# Patient Record
Sex: Female | Born: 2000 | Race: White | Hispanic: No | Marital: Single | Smoking: Never smoker
Health system: Southern US, Community
[De-identification: ages and names within clinical notes are randomized; demographics above are authoritative.]

## PROBLEM LIST (undated history)

## (undated) DIAGNOSIS — N159 Renal tubulo-interstitial disease, unspecified: Secondary | ICD-10-CM

## (undated) DIAGNOSIS — J45909 Unspecified asthma, uncomplicated: Secondary | ICD-10-CM

## (undated) DIAGNOSIS — F909 Attention-deficit hyperactivity disorder, unspecified type: Secondary | ICD-10-CM

## (undated) DIAGNOSIS — T7840XA Allergy, unspecified, initial encounter: Secondary | ICD-10-CM

## (undated) DIAGNOSIS — F419 Anxiety disorder, unspecified: Secondary | ICD-10-CM

## (undated) HISTORY — DX: Allergy, unspecified, initial encounter: T78.40XA

## (undated) HISTORY — DX: Anxiety disorder, unspecified: F41.9

## (undated) HISTORY — DX: Unspecified asthma, uncomplicated: J45.909

---
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
## 2022-02-18 ENCOUNTER — Emergency Department (HOSPITAL_COMMUNITY)
Admission: EM | Admit: 2022-02-18 | Discharge: 2022-02-18 | Disposition: A | Discharge status: Home / Self Care | Payer: No Typology Code available for payment source | Attending: Emergency Medicine | Admitting: Emergency Medicine

## 2022-02-18 ENCOUNTER — Encounter (HOSPITAL_COMMUNITY): Payer: Self-pay

## 2022-02-18 ENCOUNTER — Other Ambulatory Visit: Payer: Self-pay

## 2022-02-18 DIAGNOSIS — S61052A Open bite of left thumb without damage to nail, initial encounter: Secondary | ICD-10-CM | POA: Insufficient documentation

## 2022-02-18 DIAGNOSIS — S6992XA Unspecified injury of left wrist, hand and finger(s), initial encounter: Secondary | ICD-10-CM | POA: Diagnosis present

## 2022-02-18 DIAGNOSIS — W5501XA Bitten by cat, initial encounter: Secondary | ICD-10-CM | POA: Insufficient documentation

## 2022-02-18 DIAGNOSIS — Z203 Contact with and (suspected) exposure to rabies: Secondary | ICD-10-CM | POA: Insufficient documentation

## 2022-02-18 DIAGNOSIS — Y990 Civilian activity done for income or pay: Secondary | ICD-10-CM | POA: Insufficient documentation

## 2022-02-18 DIAGNOSIS — Z2914 Encounter for prophylactic rabies immune globin: Secondary | ICD-10-CM | POA: Diagnosis not present

## 2022-02-18 DIAGNOSIS — Z23 Encounter for immunization: Secondary | ICD-10-CM | POA: Diagnosis not present

## 2022-02-18 MED ORDER — RABIES VACCINE, PCEC IM SUSR
1.0000 mL | Freq: Once | INTRAMUSCULAR | Status: AC
  Administered 2022-02-18: 1 mL via INTRAMUSCULAR
  Filled 2022-02-18: qty 1

## 2022-02-18 MED ORDER — TETANUS-DIPHTH-ACELL PERTUSSIS 5-2.5-18.5 LF-MCG/0.5 IM SUSY
0.5000 mL | PREFILLED_SYRINGE | Freq: Once | INTRAMUSCULAR | Status: AC
  Administered 2022-02-18: 0.5 mL via INTRAMUSCULAR
  Filled 2022-02-18 (×2): qty 0.5

## 2022-02-18 MED ORDER — CIPROFLOXACIN HCL 500 MG PO TABS: 500.0000 mg | ORAL_TABLET | Freq: Two times a day (BID) | ORAL | 0 refills | Status: AC

## 2022-02-18 MED ORDER — RABIES IMMUNE GLOBULIN 150 UNIT/ML IM INJ
20.0000 [IU]/kg | INJECTION | Freq: Once | INTRAMUSCULAR | Status: AC
  Administered 2022-02-18: 1200 [IU] via INTRAMUSCULAR
  Filled 2022-02-18: qty 8

## 2022-02-18 MED ORDER — CIPROFLOXACIN HCL 500 MG PO TABS
500.0000 mg | ORAL_TABLET | Freq: Once | ORAL | Status: AC
  Administered 2022-02-18: 500 mg via ORAL
  Filled 2022-02-18: qty 1

## 2022-02-18 NOTE — Discharge Instructions (Signed)
Return for any of the following signs of wound infection: worsening swelling, redness, pain, pus drainage, streaking or fever.  

## 2022-02-18 NOTE — ED Triage Notes (Signed)
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 Bite is on left thumb. It did break the skin ?

## 2022-02-18 NOTE — ED Provider Triage Note (Signed)
Emergency Medicine Provider Triage Evaluation Note ? ?Kathleen Wyatt , a 21 y.o. female  was evaluated in triage.  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  Sent to the ED for evaluation and rabies vaccination. ? ?Review of Systems  ?Positive: Animal bite to the left thumb, hemostatic no evidence of dried skin on the thumb. ?Negative: Nausea, vomiting and diarrhea ? ?Physical Exam  ?BP 124/84 (BP Location: Right Arm)   Pulse 80   Temp 98.1 ?F (36.7 ?C) (Oral)   Resp 18   Ht 5\' 6"  (1.676 m)   Wt 59 kg   SpO2 100%   BMI 20.98 kg/m?  ?Gen:   Awake, no distress   ?Resp:  Normal effort  ?MSK:   Moves extremities without difficulty  ?Other:  Cat bite to the dorsum of the left thumb, hemostatic at this time.  No surrounding erythema, induration, or bleeding at this time. ? ?Medical Decision Making  ?Medically screening exam initiated at 2:37 AM.  Appropriate orders placed.  Kathleen Wyatt was informed that the remainder of the evaluation will be completed by another provider, this initial triage assessment does not replace that evaluation, and the importance of remaining in the ED until their evaluation is complete. ? ?Patient with history of severe hives to penicillins in the past.  Will order ciprofloxacin. ? ?This chart was dictated using voice recognition software, Dragon. Despite the best efforts of this provider to proofread and correct errors, errors may still occur which can change documentation meaning. ? ?  ?August Saucer, PA-C ?02/18/22 0249 ? ?

## 2022-02-18 NOTE — ED Provider Notes (Addendum)
?[facility] EMERGENCY DEPARTMENT ?Provider Note ? ? ?CSN: [id number] ?Arrival date & time: 02/18/22  0137 ? ?  ? ?History ? ?Chief Complaint  ?Patient presents with  ? Animal Bite  ? ? ?Kathleen Wyatt is a 21 y.o. female. ? 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  She was bitten by an unvaccinated cat at work [employer].  Has an abrasion to L thumb.  The cat was euthanized, is being sent for rabies testing. Pt would like to begin rabies vaccine series.  ? ? ?  ? ?Home Medications ?Prior to Admission medications   ?Medication Sig Start Date End Date Taking? Authorizing Provider  ?ciprofloxacin (CIPRO) 500 MG tablet Take 1 tablet (500 mg total) by mouth every 12 (twelve) hours. 02/18/22  Yes Viviano Simas, NP  ?albuterol (PROAIR HFA) 108 (90 Base) MCG/ACT inhaler Inhale 2 puffs into the lungs every 4 (four) hours as needed for wheezing or shortness of breath. 06/23/18   Alfonse Spruce, MD  ?cetirizine (ZYRTEC) 10 MG chewable tablet Chew 10 mg by mouth daily.    [provider]  ?Cholecalciferol (VITAMIN D PO) Take by mouth.    [provider]  ?cyproheptadine (PERIACTIN) 4 MG tablet Take 4 mg by mouth 2 (two) times daily.  ?Patient not taking: Reported on 07/09/2020 01/28/16   [provider]  ?dexmethylphenidate (FOCALIN XR) 20 MG 24 hr capsule Take 20 mg by mouth daily. 03/23/16   [provider]  ?EPINEPHrine (EPIPEN 2-PAK) 0.3 mg/0.3 mL IJ SOAJ injection Use as directed for severe allergic reactions 06/23/18   Alfonse Spruce, MD  ?fluticasone [facility]) 50 MCG/ACT nasal spray Place 2 sprays into both nostrils daily. 02/28/16   Fletcher Anon, MD  ?ibuprofen (ADVIL,MOTRIN) 100 MG chewable tablet Chew 100 mg by mouth every 8 (eight) hours as needed.    [provider]  ?   ? ?Allergies    ?Amoxicillin, Bee venom, Fire ant, Insect extract allergy skin test, Penicillins, Almond oil, Benadryl [diphenhydramine hcl], and Other   ? ?Review of Systems    ?Review of Systems  ?Skin:  Positive for wound.  ?All other systems reviewed and are negative. ? ?Physical Exam ?Updated Vital Signs ?BP 124/84 (BP Location: Right Arm)   Pulse 80   Temp 98.1 ?F (36.7 ?C) (Oral)   Resp 18   Ht 5\' 6"  (1.676 m)   Wt 59 kg   SpO2 100%   BMI 20.98 kg/m?  ?Physical Exam ?Vitals and nursing note reviewed.  ?Constitutional:   ?   Appearance: Normal appearance.  ?HENT:  ?   Head: Normocephalic and atraumatic.  ?   Nose: Nose normal.  ?   Mouth/Throat:  ?   Mouth: Mucous membranes are moist.  ?   Pharynx: Oropharynx is clear.  ?Eyes:  ?   Extraocular Movements: Extraocular movements intact.  ?   Conjunctiva/sclera: Conjunctivae normal.  ?Cardiovascular:  ?   Rate and Rhythm: Normal rate.  ?   Pulses: Normal pulses.  ?Pulmonary:  ?   Effort: Pulmonary effort is normal.  ?Abdominal:  ?   General: There is no distension.  ?   Palpations: Abdomen is soft.  ?Musculoskeletal:     ?   General: Normal range of motion.  ?   Cervical back: Normal range of motion.  ?Skin: ?   General: Skin is warm and dry.  ?   Capillary Refill: Capillary refill takes less than 2 seconds.  ?  Findings: No rash.  ?   Comments: Abrasion to dorsal L thumb ~3-4 mm in length, linear.   ?Neurological:  ?   General: No focal deficit present.  ?   Mental Status: She is alert and oriented to person, place, and time.  ?   Coordination: Coordination normal.  ? ? ?ED Results / Procedures / Treatments   ?Labs ?(all labs ordered are listed, but only abnormal results are displayed) ?Labs Reviewed  ?PREGNANCY, URINE  ? ? ?EKG ?None ? ?Radiology ?No results found. ? ?Procedures ?Procedures  ? ? ?Medications Ordered in ED ?Medications  ?rabies immune globulin (HYPERAB/KEDRAB) injection 1,200 Units (has no administration in time range)  ?rabies vaccine (RABAVERT) injection 1 mL (1 mL Intramuscular Given 02/18/22 0424)  ?Tdap (BOOSTRIX) injection 0.5 mL (0.5 mLs Intramuscular Given 02/18/22 0428)  ?ciprofloxacin (CIPRO) tablet 500  mg (500 mg Oral Given 02/18/22 0425)  ? ? ?ED Course/ Medical Decision Making/ A&P ?  ?                        ?Medical Decision Making ?Risk ?Prescription drug management. ? ? 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  Cat is unvaccinated against rabies.  Pt needs tetanus booster.  Cat was euthanized, is being sent for rabies testing.  Pt would like to begin rabies vaccine series while awaiting results.  WIll give 1st dose of rabies vaccine, rabies IG, boostrix.  Pt is allergic to PCN, will treat w/ cipro for infection prophylaxis.  Wound washed out w/ normal saline.  No repair necessary. No imaging or labs necessary at this time. Discussed supportive care as well need for f/u w/ PCP in 1-2 days.  Also discussed sx that warrant sooner re-eval in ED. ?Patient / Family / Caregiver informed of clinical course, understand medical decision-making process, and agree with plan. ? ? ? ? ? ? ? ? ?Final Clinical Impression(s) / ED Diagnoses ?Final diagnoses:  ?Cat bite of left thumb, initial encounter  ? ? ?Rx / DC Orders ?ED Discharge Orders   ? ?      Ordered  ?  ciprofloxacin (CIPRO) 500 MG tablet  Every 12 hours       ? 02/18/22 0417  ? ?  ?  ? ?  ? ? ?  ?Viviano Simas, NP ?02/18/22 0427 ? ?  ?Tilden Fossa, MD ?02/18/22 [phone] ? ?  ?Viviano Simas, NP ?02/18/22 1191 ? ?  ?Tilden Fossa, MD ?02/18/22 [phone] ? ?

## 2022-10-06 ENCOUNTER — Ambulatory Visit: Payer: Self-pay | Admitting: *Deleted

## 2022-10-06 NOTE — Telephone Encounter (Signed)
  Chief Complaint: "belly button ring" infected. Can not remove ring Symptoms: blood and pus draining from umbilicus area where "belly button ring" located. Can not remove ring. Has attempted to clean and unable to remove ring.  Frequency: greater than month Pertinent Negatives: Patient denies fever, no severe pain  reported.  Disposition: [x] ED /[] Urgent Care (no appt availability in office) / [] Appointment(In office/virtual)/ []  [place] [facility]/ [] Home Care/ [] Refused Recommended Disposition /[] [facility]/ [x]  Follow-up with PCP Additional Notes:   Recommended to contact PCP and see if able to remove if not or no appt available go to ED.    Reason for Disposition  [1] Looks infected (spreading redness, pus) AND [2] no fever  Answer Assessment - Initial Assessment Questions 1. APPEARANCE of INJURY: "What does the injury look like?"      Infected "belly button ring". Red with blood and pus oozing  2. SIZE: "How large is the cut?"      na 3. BLEEDING: "Is it bleeding now?" If Yes, ask: "Is it difficult to stop?"      Drainage now  4. LOCATION: "Where is the injury located?"      Umbilicus  5. ONSET: "How long ago did the injury occur?"      Greater than a month 6. MECHANISM: "Tell me how it happened."      Had "belly button" pierced x 1 year ago and has been trying to keep clean every since  7. TETANUS: "When was the last tetanus booster?"     na 8. PREGNANCY: "Is there any chance you are pregnant?" "When was your last menstrual period?"     na  Protocols used: Skin Injury-A-AH

## 2023-04-26 NOTE — ED Provider Notes (Signed)
 ------------------------------------------------------------------------------- Attestation signed by Glendia CHRISTELLA Jude, MD at 05/08/23 0215 I have reviewed and agree with the APP's findings and plan for this patient. Glendia CHRISTELLA Jude, MD -------------------------------------------------------------------------------   [facility]  ED Provider Note  [name] 22 y.o. female DOB: 2001/06/17 MRN: [id number] History   Chief Complaint  Patient presents with  . Blood in Stool    Pt sent from pcp for blood in her stool. Pt reports n/v/d and a fever that started yesterday and got worse today.    Kathleen Wyatt is a 22 y.o. female presents to the emergency department with complaint of persistent loose stools that started today, patient noticed 2 episodes of bloody diarrhea this morning, 3 loose stool was nonbloody.  Reports mild fatigue, fever x 3 days.  [place] in the the water.  Reports nausea and NBNB emesis.  Patient reports her friend also had similar symptoms including nausea, friend was also at the lake.  Denies neck pain, dizziness, syncope, chest pain, shortness of breath, abdominal pain, flank pain, dysuria, hematuria, constipation, melena.      History reviewed. No pertinent past medical history.  Past Surgical History:  Procedure Laterality Date  . Kidney surgery      Social History   Substance and Sexual Activity  Alcohol Use No   Social History   Tobacco Use  Smoking Status Never  Smokeless Tobacco Never   E-Cigarettes  . Vaping Use    . Start Date    . Cartridges/Day    . Quit Date     Social History   Substance and Sexual Activity  Drug Use No         Allergies  Allergen Reactions  . Almond Oil Anaphylaxis  . Amoxicillin Swelling  . Bee Pollen Anaphylaxis  . Bee Venom Anaphylaxis  . Fire [employer] Anaphylaxis  . Insect Extract Anaphylaxis  . Penicillin G  Anaphylaxis  . Penicillins Unknown and Anaphylaxis  . Diphenhydramine Hcl Nausea And Vomiting    Rapid heart rate and panic attacks   . Other Rash and Swelling    All tree nuts,facial swelling,throat closing    Discharge Medication List as of 04/26/2023 10:49 PM     CONTINUE these medications which have NOT CHANGED   Details  dexmethylphenidate (FOCALIN XR) 15 mg 24 hr capsule Take by mouth., Until Discontinued, Historical Med    fluoxetine hcl (PROZAC) 10 mg tablet take 1 tablet by mouth NIGHTLY, Historical Med        Review of Systems   Review of Systems  Constitutional:  Negative for activity change and fatigue.  Respiratory:  Negative for cough, chest tightness and shortness of breath.   Cardiovascular:  Negative for chest pain and palpitations.  Gastrointestinal:  Positive for blood in stool, diarrhea, nausea and vomiting.  Genitourinary:  Negative for dysuria and flank pain.  Psychiatric/Behavioral:  Negative for agitation and confusion.     Physical Exam   ED Triage Vitals [04/26/23 1906]  BP 111/73  Heart Rate 93  Resp 18  SpO2 98 %  Temp 98.5 F (36.9 C)    Physical Exam  Vitals reviewed. Constitutional: She appears well-developed and well-nourished. She does not appear distressed, does not appear ill and no respiratory distress. Not diaphoretic. Eyes: No scleral icterus.  Cardiovascular: Normal rate, regular rhythm and normal heart sounds.  Pulmonary/Chest: No respiratory distress. Not tachypneic. Respiratory effort normal and breath sounds normal.  Abdominal: Soft. There is no abdominal tenderness. There is no guarding and no rebound. Abdomen not distended. Bowel sounds are normal. There is no CVA tenderness.  Musculoskeletal: no edema.  Neurological: She is alert and oriented to person, place, and time.  Skin: Not diaphoretic.    ED Course   Lab results:   CBC AND DIFFERENTIAL - Abnormal      Result Value   WBC 2.1 (*)    RBC 4.73     HGB 13.3      HCT 39.7     MCV 83.9     MCH 28.1     MCHC 33.5     Plt Ct 204     Comment: Platelet clumping noted on smear; count appears adequate.    RDW SD 35.8     MPV 9.7     NRBC% 0.0     Absolute NRBC Count 0.00    COMPREHENSIVE METABOLIC PANEL - Abnormal   Na 139     Potassium 4.0     Cl 104     CO2 23     AGAP 12     Glucose 99     BUN 7     Creatinine 0.81     Ca 9.3     ALK PHOS 58     T Bili 0.30     Total Protein 7.0     Alb 4.4     GLOBULIN 2.6     ALBUMIN/GLOBULIN RATIO 1.7     BUN/CREAT RATIO 8.6 (*)    ALT 9     AST 15     eGFR 107     Comment: Normal GFR (glomerular filtration rate) > 60 mL/min/1.73 meters squared, < 60 may include impaired kidney function. Calculation based on the Chronic Kidney Disease Epidemiology Collaboration (CK-EPI)equation refit without adjustment for race.  MANUAL DIFFERENTIAL - Abnormal   Segmented Neutrophil % Man 49     Lymphocyte % Manual 23     Monocyte % Manual 13     Eosinophil % Manual 1     Variant Lymph % Manual 14     Absolute Neutrophil Count, Manual 1.03 (*)    Absolute Lymphocyte Count, Manual 0.48 (*)    Absolute Monocyte Count, Manual 0.27     Absolute Eosinophil Count, Manual 0.02     PLATELET EST Adequate     Comment: Platelet clumping noted on smear; count appears adequate.    RBC MORPH Normal     Total Absolute Lymphocyte Count Manual 0.78 (*)    Absolute Variant Lymph Count, Manual 0.29    LIPASE - Normal   Lipase 22    HUMAN CHORIONIC GONADOTROPIN (HCG), BETA-SUBUNIT, QUALITATIVE - Normal   Beta HCG Qual Negative    URINALYSIS W/MICRO REFLEX CULTURE - SYMPTOMATIC - Normal   Urine Color Yellow     Urine Clarity Clear     Urine Specific Gravity 1.014     Urine pH 5.5     Urine Protein - Dipstick Negative     Urine Glucose Negative     Urine Ketones Negative     Urine Bilirubin Negative     Urine Blood Negative     Urine Nitrite Negative     Urine Urobilinogen <2     Urine Leukocyte Esterase  Negative     UA Microscopic No Micro     Narrative:    Does not meet criteria for reflex to Urine Culture.  BIOFIRE GI PANEL  LIGHT BLUE TOP  GOLD SST    Imaging: No data to display  ECG: ECG Results   None                               Pre-Sedation Procedures    Medical Decision Making Differential diagnosis include, but are not limited to: Viral versus bacterial gastroenteritis, colitis, Giardia  Patient presents with 2 episodes of bloody diarrhea this morning which resolved, ongoing loose stools, reports nausea and vomiting x 3 days. Vital signs stable, afebrile, nontoxic.  Unremarkable GI exam. CBC with mild leukopenia and lymphocytopenia.  CMP, normal renal function, normal electrolytes, normal T. bili and LFTs.UA normal.  Lipase normal.  Negative pregnancy test. Patient declined IV fluids in the ED.  Patient was given Zofran  oral with improvement in nausea.  Patient drank Gatorade ED without nausea or vomiting.  Clinical course suggestive of infectious diarrhea.  First dose of azithromycin given in the ED. Patient was discharged in stable condition with a prescription for azithromycin and Zofran .  Patient was advised to follow-up with her PCP in 3 to 5 days.  My exam, findings, treatment plan has been discussed with the patient.  Patient understands and agrees with the findings, diagnosis, and treatment plan.   Strict return and follow-up precautions have been given by me personally to the patient/family/caregiver(s).  Please see the  'After Visit Summary' discharge instructions for additional discharge plan.   Risk OTC drugs. Prescription drug management.   Amount and/or Complexity of Data Reviewed Labs: ordered. Decision-making details documented in ED Course.  Risk Prescription drug management.       Provider Communication  Discharge Medication List as of 04/26/2023 10:49 PM     START taking these medications   Details  azithromycin  (ZITHROMAX) 500 mg tablet Take one tablet (500 mg dose) by mouth daily for 2 days., Starting Mon 04/26/2023, Until Wed 04/28/2023, Normal    ondansetron  (ZOFRAN -ODT) 4 mg disintegrating tablet Take one tablet (4 mg dose) by mouth every 8 (eight) hours as needed for Nausea for up to 7 days., Starting Mon 04/26/2023, Until Mon 05/03/2023 at 2359, Normal        Discharge Medication List as of 04/26/2023 10:49 PM      Discharge Medication List as of 04/26/2023 10:49 PM      Clinical Impression   Final diagnoses:  Diarrhea of presumed infectious origin  Nausea and vomiting, unspecified vomiting type    ED Disposition     ED Disposition  Discharge   Condition  Stable   Comment  --                 Follow-up Information     Hadassah JONETTA Mulberry, MD. Schedule an appointment as soon as possible for a visit in 5 days.   Specialty: Pediatrics Contact information: [address] [place] [address] [phone]                  Electronically signed by:    Donnamarie Laine Pike, PA-C 05/08/23 [id number]

## 2024-02-05 ENCOUNTER — Emergency Department (HOSPITAL_BASED_OUTPATIENT_CLINIC_OR_DEPARTMENT_OTHER): Admission: EM | Admit: 2024-02-05 | Discharge: 2024-02-05 | Disposition: A | Discharge status: Home / Self Care

## 2024-02-05 ENCOUNTER — Encounter (HOSPITAL_BASED_OUTPATIENT_CLINIC_OR_DEPARTMENT_OTHER): Payer: Self-pay | Admitting: Emergency Medicine

## 2024-02-05 DIAGNOSIS — J45909 Unspecified asthma, uncomplicated: Secondary | ICD-10-CM | POA: Diagnosis not present

## 2024-02-05 DIAGNOSIS — R197 Diarrhea, unspecified: Secondary | ICD-10-CM

## 2024-02-05 DIAGNOSIS — R1030 Lower abdominal pain, unspecified: Secondary | ICD-10-CM | POA: Diagnosis present

## 2024-02-05 LAB — I-STAT CHEM 8, ED
BUN: 9 mg/dL (ref 6–20)
Calcium, Ion: 1.22 mmol/L (ref 1.15–1.40)
Chloride: 107 mmol/L (ref 98–111)
Creatinine, Ser: 0.7 mg/dL (ref 0.44–1.00)
Glucose, Bld: 119 mg/dL — ABNORMAL HIGH (ref 70–99)
HCT: 40 % (ref 36.0–46.0)
Hemoglobin: 13.6 g/dL (ref 12.0–15.0)
Potassium: 3.8 mmol/L (ref 3.5–5.1)
Sodium: 140 mmol/L (ref 135–145)
TCO2: 23 mmol/L (ref 22–32)

## 2024-02-05 LAB — URINALYSIS, ROUTINE W REFLEX MICROSCOPIC
Bilirubin Urine: NEGATIVE
Glucose, UA: NEGATIVE mg/dL
Hgb urine dipstick: NEGATIVE
Ketones, ur: NEGATIVE mg/dL
Leukocytes,Ua: NEGATIVE
Nitrite: NEGATIVE
Protein, ur: NEGATIVE mg/dL
Specific Gravity, Urine: 1.03 (ref 1.005–1.030)
pH: 6.5 (ref 5.0–8.0)

## 2024-02-05 LAB — RESP PANEL BY RT-PCR (RSV, FLU A&B, COVID)  RVPGX2
Influenza A by PCR: NEGATIVE
Influenza B by PCR: NEGATIVE
Resp Syncytial Virus by PCR: NEGATIVE
SARS Coronavirus 2 by RT PCR: NEGATIVE

## 2024-02-05 LAB — CBC
HCT: 39.3 % (ref 36.0–46.0)
Hemoglobin: 13.3 g/dL (ref 12.0–15.0)
MCH: 27.4 pg (ref 26.0–34.0)
MCHC: 33.8 g/dL (ref 30.0–36.0)
MCV: 81 fL (ref 80.0–100.0)
Platelets: 274 10*3/uL (ref 150–400)
RBC: 4.85 MIL/uL (ref 3.87–5.11)
RDW: 12 % (ref 11.5–15.5)
WBC: 8.2 10*3/uL (ref 4.0–10.5)
nRBC: 0 % (ref 0.0–0.2)

## 2024-02-05 LAB — PREGNANCY, URINE: Preg Test, Ur: NEGATIVE

## 2024-02-05 MED ORDER — ONDANSETRON HCL 4 MG PO TABS: 4.0000 mg | ORAL_TABLET | Freq: Four times a day (QID) | ORAL | 0 refills | Status: AC

## 2024-02-05 MED ORDER — DICYCLOMINE HCL 20 MG PO TABS: 20.0000 mg | ORAL_TABLET | Freq: Two times a day (BID) | ORAL | 0 refills | Status: DC | PRN

## 2024-02-05 MED ORDER — DICYCLOMINE HCL 20 MG PO TABS: 20.0000 mg | ORAL_TABLET | Freq: Two times a day (BID) | ORAL | 0 refills | Status: AC | PRN

## 2024-02-05 MED ORDER — ONDANSETRON HCL 4 MG PO TABS: 4.0000 mg | ORAL_TABLET | Freq: Four times a day (QID) | ORAL | 0 refills | Status: DC

## 2024-02-05 NOTE — Discharge Instructions (Addendum)
 Please follow-up the department doctor, we also given the number to a gastroenterologist that she can follow-up if your symptoms persist.  Return immediately if develop fevers, chills, severe abdominal pain, profuse bloody diarrhea, lightheadedness, passout, chest pain, shortness of breath uncontrolled nausea vomiting or any new or worsening symptoms that are concerning to you.

## 2024-02-05 NOTE — ED Notes (Signed)
 RT Note: Chem 8 completed by RT

## 2024-02-05 NOTE — ED Notes (Signed)
 Lab called to inform insufficient quantity received for stool sample, recollection required. Pt made aware and states that she doesn't think she can leave another sample at this time.

## 2024-02-05 NOTE — ED Triage Notes (Signed)
 Pt reports lower bad pain that started last night; sts she is passing bright red blood with BMs today

## 2024-02-05 NOTE — ED Notes (Signed)
 Pt unable to leave stool sample at this time. Will attempt later if able to.

## 2024-02-05 NOTE — ED Provider Notes (Signed)
 [facility] Provider Note   CSN: [id number] Arrival date & time: 02/05/24  1159     History  Chief Complaint  Patient presents with   Abdominal Pain    Kathleen Wyatt is a 23 y.o. female.  This is a 23 year old female presenting emergency department for crampy lower abdominal pain this morning followed by what look like bloody diarrhea.  She had a few episodes of normal diarrhea followed by what appeared to be blood.  She noted she had some diffuse lower abdominal pain, is currently resolved and not having any symptoms.  No nausea no vomiting.  No fevers no chills.  Not on blood thinner.  She reports prior bloody diarrhea, but was thought to be secondary to an infectious process.   Abdominal Pain      Home Medications Prior to Admission medications   Medication Sig Start Date End Date Taking? Authorizing Provider  albuterol (PROAIR HFA) 108 (90 Base) MCG/ACT inhaler Inhale 2 puffs into the lungs every 4 (four) hours as needed for wheezing or shortness of breath. 06/23/18   Alfonse Spruce, MD  cetirizine (ZYRTEC) 10 MG chewable tablet Chew 10 mg by mouth daily.    [provider]  Cholecalciferol (VITAMIN D PO) Take by mouth.    [provider]  ciprofloxacin (CIPRO) 500 MG tablet Take 1 tablet (500 mg total) by mouth every 12 (twelve) hours. 02/18/22   Viviano Simas, NP  cyproheptadine (PERIACTIN) 4 MG tablet Take 4 mg by mouth 2 (two) times daily.  Patient not taking: Reported on 07/09/2020 01/28/16   [provider]  dexmethylphenidate (FOCALIN XR) 20 MG 24 hr capsule Take 20 mg by mouth daily. 03/23/16   [provider]  EPINEPHrine (EPIPEN 2-PAK) 0.3 mg/0.3 mL IJ SOAJ injection Use as directed for severe allergic reactions 06/23/18   Alfonse Spruce, MD  fluticasone [facility]) 50 MCG/ACT nasal spray Place 2 sprays into both nostrils daily. 02/28/16   Fletcher Anon, MD  ibuprofen  (ADVIL,MOTRIN) 100 MG chewable tablet Chew 100 mg by mouth every 8 (eight) hours as needed.    [provider]      Allergies    Amoxicillin, Bee venom, Fire ant, Insect extract, Penicillins, Almond oil, Benadryl [diphenhydramine hcl], and Other    Review of Systems   Review of Systems  Gastrointestinal:  Positive for abdominal pain.    Physical Exam Updated Vital Signs BP 103/76   Pulse 84   Temp 98.3 F (36.8 C) (Oral)   Resp 16   Ht 5\' 8"  (1.727 m)   Wt 63.5 kg   SpO2 100%   BMI 21.29 kg/m  Physical Exam Vitals and nursing note reviewed.  Constitutional:      General: She is not in acute distress.    Appearance: She is not toxic-appearing.  HENT:     Head: Normocephalic and atraumatic.  Abdominal:     General: Abdomen is flat.     Palpations: Abdomen is soft.     Tenderness: There is no abdominal tenderness.  Skin:    Capillary Refill: Capillary refill takes less than 2 seconds.  Neurological:     Mental Status: She is alert and oriented to person, place, and time.  Psychiatric:        Mood and Affect: Mood normal.        Behavior: Behavior normal.     ED Results / Procedures / Treatments   Labs (all labs ordered  are listed, but only abnormal results are displayed) Labs Reviewed  I-STAT CHEM 8, ED - Abnormal; Notable for the following components:      Result Value   Glucose, Bld 119 (*)    All other components within normal limits  RESP PANEL BY RT-PCR (RSV, FLU A&B, COVID)  [id number]  GASTROINTESTINAL PANEL BY PCR, STOOL (REPLACES STOOL CULTURE)  CBC  URINALYSIS, ROUTINE W REFLEX MICROSCOPIC  PREGNANCY, URINE    EKG None  Radiology No results found.  Procedures Procedures    Medications Ordered in ED Medications - No data to display  ED Course/ Medical Decision Making/ A&P                                 Medical Decision Making This is a 23 year old female with history of asthma, anxiety, ADHD presenting to the emergency  department for crampy lower abdominal pain and reported episode of bloody diarrhea.  She is afebrile nontachycardic hemodynamically stable.  Benign abdominal exam.  Labs with no anemia.  No significant metabolic derangements.  Normal kidney function.  BUN went against upper GI bleed.  Not having tenderness in the epigastrium either.  Pregnancy test negative.  Ectopic pregnancy unlikely.  UA not consistent with urinary tract infection.  Stool studies ordered, however patient was unable to provide sample.  Discussed supportive care and following up with primary doctor.  Patient is agreeable to plan.  Will discharge with Bentyl and Zofran.  Amount and/or Complexity of Data Reviewed Independent Historian:     Details: none External Data Reviewed:     Details: Not on blood thinner Labs: ordered. Decision-making details documented in ED Course. Radiology:     Details: Considered CT abdomen, however not having abdominal pain currently and a benign abdominal exam, normal vitals and normal labs.  Low suspicion for acute surgical pathology.  Risk Decision regarding hospitalization.          Final Clinical Impression(s) / ED Diagnoses Final diagnoses:  None    Rx / DC Orders ED Discharge Orders     None         Coral Spikes, DO 02/05/24 1424

## 2024-02-06 LAB — GASTROINTESTINAL PANEL BY PCR, STOOL (REPLACES STOOL CULTURE)

## 2024-10-07 ENCOUNTER — Encounter (HOSPITAL_BASED_OUTPATIENT_CLINIC_OR_DEPARTMENT_OTHER): Payer: Self-pay | Admitting: Emergency Medicine

## 2024-10-07 ENCOUNTER — Emergency Department (HOSPITAL_BASED_OUTPATIENT_CLINIC_OR_DEPARTMENT_OTHER)
Admission: EM | Admit: 2024-10-07 | Discharge: 2024-10-07 | Disposition: A | Discharge status: Home / Self Care | Payer: Self-pay | Attending: Emergency Medicine | Admitting: Emergency Medicine

## 2024-10-07 ENCOUNTER — Other Ambulatory Visit: Payer: Self-pay

## 2024-10-07 ENCOUNTER — Emergency Department (HOSPITAL_BASED_OUTPATIENT_CLINIC_OR_DEPARTMENT_OTHER): Payer: Self-pay

## 2024-10-07 DIAGNOSIS — N939 Abnormal uterine and vaginal bleeding, unspecified: Secondary | ICD-10-CM | POA: Insufficient documentation

## 2024-10-07 DIAGNOSIS — R000 Tachycardia, unspecified: Secondary | ICD-10-CM | POA: Insufficient documentation

## 2024-10-07 LAB — CBC WITH DIFFERENTIAL/PLATELET
Abs Immature Granulocytes: 0.01 K/uL (ref 0.00–0.07)
Basophils Absolute: 0 K/uL (ref 0.0–0.1)
Basophils Relative: 1 %
Eosinophils Absolute: 0.1 K/uL (ref 0.0–0.5)
Eosinophils Relative: 2 %
HCT: 43.1 % (ref 36.0–46.0)
Hemoglobin: 14.5 g/dL (ref 12.0–15.0)
Immature Granulocytes: 0 %
Lymphocytes Relative: 35 %
Lymphs Abs: 1.9 K/uL (ref 0.7–4.0)
MCH: 27.5 pg (ref 26.0–34.0)
MCHC: 33.6 g/dL (ref 30.0–36.0)
MCV: 81.8 fL (ref 80.0–100.0)
Monocytes Absolute: 0.6 K/uL (ref 0.1–1.0)
Monocytes Relative: 10 %
Neutro Abs: 2.9 K/uL (ref 1.7–7.7)
Neutrophils Relative %: 52 %
Platelets: 296 K/uL (ref 150–400)
RBC: 5.27 MIL/uL — ABNORMAL HIGH (ref 3.87–5.11)
RDW: 12.2 % (ref 11.5–15.5)
WBC: 5.5 K/uL (ref 4.0–10.5)
nRBC: 0 % (ref 0.0–0.2)

## 2024-10-07 LAB — PROTIME-INR
INR: 1 (ref 0.8–1.2)
Prothrombin Time: 13.5 s (ref 11.4–15.2)

## 2024-10-07 LAB — URINALYSIS, MICROSCOPIC (REFLEX)

## 2024-10-07 LAB — URINALYSIS, ROUTINE W REFLEX MICROSCOPIC
Bilirubin Urine: NEGATIVE
Glucose, UA: NEGATIVE mg/dL
Ketones, ur: NEGATIVE mg/dL
Leukocytes,Ua: NEGATIVE
Nitrite: NEGATIVE
Protein, ur: NEGATIVE mg/dL
Specific Gravity, Urine: 1.02 (ref 1.005–1.030)
pH: 7.5 (ref 5.0–8.0)

## 2024-10-07 LAB — BASIC METABOLIC PANEL WITH GFR
Anion gap: 13 (ref 5–15)
BUN: 7 mg/dL (ref 6–20)
CO2: 24 mmol/L (ref 22–32)
Calcium: 10.2 mg/dL (ref 8.9–10.3)
Chloride: 103 mmol/L (ref 98–111)
Creatinine, Ser: 0.71 mg/dL (ref 0.44–1.00)
GFR, Estimated: >60 mL/min (ref >60)
Glucose, Bld: 85 mg/dL (ref 70–99)
Potassium: 3.8 mmol/L (ref 3.5–5.1)
Sodium: 140 mmol/L (ref 135–145)

## 2024-10-07 LAB — WET PREP, GENITAL
Clue Cells Wet Prep HPF POC: NONE SEEN
Sperm: NONE SEEN
Trich, Wet Prep: NONE SEEN
WBC, Wet Prep HPF POC: <10 (ref <10)
Yeast Wet Prep HPF POC: NONE SEEN

## 2024-10-07 LAB — PREGNANCY, URINE: Preg Test, Ur: NEGATIVE

## 2024-10-07 MED ORDER — NORETHINDRONE ACETATE 5 MG PO TABS
10.0000 mg | ORAL_TABLET | Freq: Every day | ORAL | 0 refills | Status: AC
Start: 2024-10-07 — End: ?

## 2024-10-07 NOTE — ED Notes (Signed)
 Assuming pt care at this time.

## 2024-10-07 NOTE — ED Provider Notes (Signed)
 [place] [facility] Provider Note   CSN: [id number] Arrival date & time: 10/07/24  1951     Patient presents with: Vaginal Bleeding   Kathleen Wyatt is a 23 y.o. female.  She said she had an IUD placed back in December.  About a month ago she started experiencing some vaginal bleeding that lasted a few weeks.  It stopped but recurred again last night very heavy associated with some pelvic cramping.  No fevers chills nausea vomiting.  Tried some Tylenol  without improvement.  No recent vaginal discharge.  Denies concern for pregnancy.   The history is provided by the patient.  Vaginal Bleeding Quality:  Dark red Severity:  Severe Onset quality:  Gradual Duration:  2 days Timing:  Constant Progression:  Unchanged Context: spontaneously   Relieved by:  Nothing Ineffective treatments:  Acetaminophen  Associated symptoms: abdominal pain   Associated symptoms: no dysuria, no fever, no nausea and no vaginal discharge        Prior to Admission medications   Medication Sig Start Date End Date Taking? Authorizing Provider  albuterol  (PROAIR  HFA) 108 (90 Base) MCG/ACT inhaler Inhale 2 puffs into the lungs every 4 (four) hours as needed for wheezing or shortness of breath. 06/23/18   Iva Marty Saltness, MD  cetirizine (ZYRTEC) 10 MG chewable tablet Chew 10 mg by mouth daily.    [provider]  Cholecalciferol (VITAMIN D PO) Take by mouth.    [provider]  ciprofloxacin  (CIPRO ) 500 MG tablet Take 1 tablet (500 mg total) by mouth every 12 (twelve) hours. 02/18/22   Lang Maxwell, NP  cyproheptadine (PERIACTIN) 4 MG tablet Take 4 mg by mouth 2 (two) times daily.  Patient not taking: Reported on 07/09/2020 01/28/16   [provider]  dexmethylphenidate (FOCALIN XR) 20 MG 24 hr capsule Take 20 mg by mouth daily. 03/23/16   [provider]  dicyclomine  (BENTYL ) 20 MG tablet Take 1 tablet (20 mg total) by mouth 2 (two)  times daily as needed for up to 7 days for spasms. 02/05/24 02/12/24  Neysa Caron PARAS, DO  EPINEPHrine  (EPIPEN  2-PAK) 0.3 mg/0.3 mL IJ SOAJ injection Use as directed for severe allergic reactions 06/23/18   Iva Marty Saltness, MD  fluticasone  (FLONASE ) 50 MCG/ACT nasal spray Place 2 sprays into both nostrils daily. 02/28/16   Asa Aloysius LABOR, MD  ibuprofen (ADVIL,MOTRIN) 100 MG chewable tablet Chew 100 mg by mouth every 8 (eight) hours as needed.    [provider]  ondansetron  (ZOFRAN ) 4 MG tablet Take 1 tablet (4 mg total) by mouth every 6 (six) hours. 02/05/24   Neysa Caron PARAS, DO    Allergies: Amoxicillin, Bee venom, Fire ant, Insect extract, Penicillins, Almond oil, Benadryl [diphenhydramine hcl], and Other    Review of Systems  Constitutional:  Negative for fever.  Gastrointestinal:  Positive for abdominal pain. Negative for nausea.  Genitourinary:  Positive for vaginal bleeding. Negative for dysuria and vaginal discharge.    Updated Vital Signs BP 120/82 (BP Location: Right Arm)   Pulse (!) 104   Temp 98 F (36.7 C) (Oral)   Resp (!) 24   Ht 5' 9 (1.753 m)   Wt 68 kg   SpO2 97%   BMI 22.15 kg/m   Physical Exam Vitals and nursing note reviewed.  Constitutional:      General: She is not in acute distress.    Appearance: Normal appearance. She is well-developed.  HENT:  Head: Normocephalic and atraumatic.  Eyes:     Conjunctiva/sclera: Conjunctivae normal.  Cardiovascular:     Rate and Rhythm: Regular rhythm. Tachycardia present.     Heart sounds: No murmur heard. Pulmonary:     Effort: Pulmonary effort is normal. No respiratory distress.     Breath sounds: Normal breath sounds. No stridor. No wheezing.  Abdominal:     Palpations: Abdomen is soft.     Tenderness: There is no abdominal tenderness. There is no guarding or rebound.  Musculoskeletal:        General: No deformity.     Cervical back: Neck supple.  Skin:    General: Skin is warm and dry.   Neurological:     General: No focal deficit present.     Mental Status: She is alert.     GCS: GCS eye subscore is 4. GCS verbal subscore is 5. GCS motor subscore is 6.     (all labs ordered are listed, but only abnormal results are displayed) Labs Reviewed  CBC WITH DIFFERENTIAL/PLATELET - Abnormal; Notable for the following components:      Result Value   RBC 5.27 (*)    All other components within normal limits  URINALYSIS, ROUTINE W REFLEX MICROSCOPIC - Abnormal; Notable for the following components:   Hgb urine dipstick LARGE (*)    All other components within normal limits  URINALYSIS, MICROSCOPIC (REFLEX) - Abnormal; Notable for the following components:   Bacteria, UA RARE (*)    All other components within normal limits  WET PREP, GENITAL  BASIC METABOLIC PANEL WITH GFR  PREGNANCY, URINE  PROTIME-INR  GC/CHLAMYDIA PROBE AMP ([place]) NOT AT [facility]    EKG: None  Radiology: US  PELVIC COMPLETE W TRANSVAGINAL AND TORSION R/O Result Date: 10/07/2024 EXAM: US  Pelvis, Complete Transvaginal and Transabdominal with Doppler 10/07/2024 09:07:11 PM TECHNIQUE: Transabdominal and transvaginal pelvic duplex ultrasound using B-mode/gray scaled imaging with Doppler spectral analysis and color flow was obtained. COMPARISON: None available CLINICAL HISTORY: Pelvic pain. FINDINGS: UTERUS: The uterus is anteverted, measuring 5.3 x 2.5 x 3.9 cm, with a volume of 23.3 ml. No wall mass is seen. An IUD is inserted with the stem in the lower uterus and the T arms transversely oriented in the distal cavity without appreciable myometrial extension. The positioning appears appropriate. The cervix is unremarkable apart from trace fluid in the endocervical canal. Uterus demonstrates normal myometrial echotexture. ENDOMETRIAL STRIPE: The endometrial complex measures 9.2 mm, without focal abnormality. RIGHT OVARY: The right ovary is 3.0 x 2.5 x 2.1 cm, volume 8.3 ml. There is a 1.6 cm simple dominant  follicle, but no suspicious lesion. There is preservation of normal color flow to this ovary. Right ovary is within normal limits. There is normal arterial and venous Doppler flow. LEFT OVARY: The left ovary is 3.1 x 2.0 x 2.5 cm, volume 7.7 ml. There is a 1.8 cm simple dominant follicle within the ovary, but no suspicious lesion. There is preservation of normal color flow to this ovary. Left ovary is within normal limits. There is normal arterial and venous Doppler flow. FREE FLUID: No free fluid. No adnexal mass is evident. IMPRESSION: 1. Appropriately positioned intrauterine device by ultrasound imaging . 2. Dominant simple follicles in both ovaries. No suspicious lesion or torsion. 3. Trace fluid in the endocervical canal with otherwise unremarkable cervix. 4. Otherwise negative ultrasound. Electronically signed by: Francis Quam MD 10/07/2024 09:20 PM EST RP Workstation: [id number]     Procedures   Medications Ordered  in the ED - No data to display  Clinical Course as of 10/08/24 9076  Sat Oct 07, 2024  2154 Pelvic exam done by PA Soto at patient's request for a female provider.  No significant abnormalities other than moderate vaginal bleeding. [MB]  2238 Discussed with Dr. Henry Ee Gyn.  He said start norethindrone  acetate 10 mg daily and have follow-up in clinic. [MB]    Clinical Course User Index [MB] Towana Ozell BROCKS, MD                                 Medical Decision Making Amount and/or Complexity of Data Reviewed Labs: ordered. Radiology: ordered.  Risk Prescription drug management.   This patient complains of heavy vaginal bleeding; this involves an extensive number of treatment Options and is a complaint that carries with it a high risk of complications and morbidity. The differential includes dysfunctional uterine bleeding, IUD malposition, infection, torsion, ectopic  I ordered, reviewed and interpreted labs, which included pregnancy negative, hemoglobin stable,  urinalysis without signs of infection I ordered imaging studies which included pelvic ultrasound and I independently    visualized and interpreted imaging which showed IUD in good position, no acute findings Additional history obtained from patient's mother by phone Previous records obtained and reviewed in epic including prior GYN notes I consulted [facility] Dr. Henry and discussed lab and imaging findings and discussed disposition.  Cardiac monitoring reviewed, sinus rhythm Social determinants considered, no significant barriers Critical Interventions: None  After the interventions stated above, I reevaluated the patient and found patient to be hemodynamically stable and well-appearing Admission and further testing considered, no indications for admission or further workup at this time.  Per GYN recommendations will start on hormone treatment.  Recommended close outpatient follow-up.  Return instructions discussed      Final diagnoses:  Abnormal vaginal bleeding    ED Discharge Orders          Ordered    norethindrone  (AYGESTIN ) 5 MG tablet  Daily        10/07/24 2243               Towana Ozell BROCKS, MD 10/08/24 [phone]

## 2024-10-07 NOTE — ED Provider Notes (Signed)
 Physical Exam Vitals and nursing note reviewed. Exam conducted with a chaperone present.  Genitourinary:    General: Normal vulva.     Labia:        Right: No rash or tenderness.        Left: No rash or tenderness.      Vagina: Normal.     Cervix: Cervical bleeding present. No cervical motion tenderness or erythema.     Uterus: Normal.      Adnexa: Right adnexa normal and left adnexa normal.       Right: No tenderness.         Left: No tenderness.       Comments: Chaperoned by RN Luke   Patient requesting female provider for her exam, I have performed the exam by request of Dr. Towana. Her exam was overall benign.   Portions of this note were generated with Scientist, clinical (histocompatibility and immunogenetics). Dictation errors may occur despite best attempts at proofreading.     Nichola Warren, PA-C 10/07/24 2210    Towana Ozell BROCKS, MD 10/08/24 [phone]

## 2024-10-07 NOTE — ED Notes (Signed)
 US  at bedside.

## 2024-10-07 NOTE — ED Triage Notes (Signed)
 Pt reports abnormal vaginal bleeding (heavy for her) since last night; she has an IUD; c/o pelvic cramping

## 2024-10-09 LAB — GC/CHLAMYDIA PROBE AMP (~~LOC~~) NOT AT ARMC
Chlamydia: NEGATIVE
Comment: NEGATIVE
Comment: NORMAL
Neisseria Gonorrhea: NEGATIVE
